# Patient Record
Sex: Male | Born: 1990 | Race: Black or African American | Hispanic: No | Marital: Single | Smoking: Current every day smoker
Health system: Southern US, Community
[De-identification: ages and names within clinical notes are randomized; demographics above are authoritative.]

## PROBLEM LIST (undated history)

## (undated) DIAGNOSIS — I471 Supraventricular tachycardia: Secondary | ICD-10-CM

---

## 2021-11-16 ENCOUNTER — Encounter (HOSPITAL_BASED_OUTPATIENT_CLINIC_OR_DEPARTMENT_OTHER): Payer: Self-pay | Admitting: *Deleted

## 2021-11-16 ENCOUNTER — Emergency Department (HOSPITAL_BASED_OUTPATIENT_CLINIC_OR_DEPARTMENT_OTHER)
Admission: EM | Admit: 2021-11-16 | Discharge: 2021-11-16 | Disposition: A | Discharge status: Home / Self Care | Payer: Self-pay | Attending: Emergency Medicine | Admitting: Emergency Medicine

## 2021-11-16 ENCOUNTER — Other Ambulatory Visit: Payer: Self-pay

## 2021-11-16 ENCOUNTER — Emergency Department (HOSPITAL_BASED_OUTPATIENT_CLINIC_OR_DEPARTMENT_OTHER): Payer: Self-pay

## 2021-11-16 DIAGNOSIS — D72829 Elevated white blood cell count, unspecified: Secondary | ICD-10-CM | POA: Insufficient documentation

## 2021-11-16 DIAGNOSIS — Z720 Tobacco use: Secondary | ICD-10-CM | POA: Insufficient documentation

## 2021-11-16 DIAGNOSIS — R531 Weakness: Secondary | ICD-10-CM | POA: Insufficient documentation

## 2021-11-16 DIAGNOSIS — F1590 Other stimulant use, unspecified, uncomplicated: Secondary | ICD-10-CM | POA: Insufficient documentation

## 2021-11-16 DIAGNOSIS — R0789 Other chest pain: Secondary | ICD-10-CM

## 2021-11-16 DIAGNOSIS — F1510 Other stimulant abuse, uncomplicated: Secondary | ICD-10-CM

## 2021-11-16 DIAGNOSIS — R072 Precordial pain: Secondary | ICD-10-CM | POA: Insufficient documentation

## 2021-11-16 DIAGNOSIS — M79662 Pain in left lower leg: Secondary | ICD-10-CM | POA: Insufficient documentation

## 2021-11-16 DIAGNOSIS — W010XXA Fall on same level from slipping, tripping and stumbling without subsequent striking against object, initial encounter: Secondary | ICD-10-CM | POA: Insufficient documentation

## 2021-11-16 DIAGNOSIS — J00 Acute nasopharyngitis [common cold]: Secondary | ICD-10-CM | POA: Insufficient documentation

## 2021-11-16 HISTORY — DX: Supraventricular tachycardia: I47.1

## 2021-11-16 LAB — RAPID URINE DRUG SCREEN, HOSP PERFORMED
Amphetamines: POSITIVE — AB
Barbiturates: NOT DETECTED
Benzodiazepines: NOT DETECTED
Cocaine: NOT DETECTED
Opiates: NOT DETECTED
Tetrahydrocannabinol: POSITIVE — AB

## 2021-11-16 LAB — BASIC METABOLIC PANEL
Anion gap: 7 (ref 5–15)
Anion gap: 12 (ref 5–15)
BUN: 13 mg/dL (ref 6–20)
BUN: 16 mg/dL (ref 6–20)
CO2: 30 mmol/L (ref 22–32)
CO2: 25 mmol/L (ref 22–32)
Calcium: 9.1 mg/dL (ref 8.9–10.3)
Calcium: 9.2 mg/dL (ref 8.9–10.3)
Chloride: 99 mmol/L (ref 98–111)
Chloride: 99 mmol/L (ref 98–111)
Creatinine, Ser: 1.02 mg/dL (ref 0.61–1.24)
Creatinine, Ser: 0.86 mg/dL (ref 0.61–1.24)
GFR, Estimated: >60 mL/min (ref >60)
GFR, Estimated: >60 mL/min (ref >60)
Glucose, Bld: 116 mg/dL — ABNORMAL HIGH (ref 70–99)
Glucose, Bld: 105 mg/dL — ABNORMAL HIGH (ref 70–99)
Potassium: 3.9 mmol/L (ref 3.5–5.1)
Potassium: 4.2 mmol/L (ref 3.5–5.1)
Sodium: 136 mmol/L (ref 135–145)
Sodium: 136 mmol/L (ref 135–145)

## 2021-11-16 LAB — CBC
HCT: 41.7 % (ref 39.0–52.0)
HCT: 42.3 % (ref 39.0–52.0)
Hemoglobin: 14.1 g/dL (ref 13.0–17.0)
Hemoglobin: 14.1 g/dL (ref 13.0–17.0)
MCH: 30.5 pg (ref 26.0–34.0)
MCH: 30.2 pg (ref 26.0–34.0)
MCHC: 33.8 g/dL (ref 30.0–36.0)
MCHC: 33.3 g/dL (ref 30.0–36.0)
MCV: 90.3 fL (ref 80.0–100.0)
MCV: 90.6 fL (ref 80.0–100.0)
Platelets: 367 10*3/uL (ref 150–400)
Platelets: 377 10*3/uL (ref 150–400)
RBC: 4.62 MIL/uL (ref 4.22–5.81)
RBC: 4.67 MIL/uL (ref 4.22–5.81)
RDW: 12.6 % (ref 11.5–15.5)
RDW: 12.6 % (ref 11.5–15.5)
WBC: 24.2 10*3/uL — ABNORMAL HIGH (ref 4.0–10.5)
WBC: 21.5 10*3/uL — ABNORMAL HIGH (ref 4.0–10.5)
nRBC: 0 % (ref 0.0–0.2)
nRBC: 0 % (ref 0.0–0.2)

## 2021-11-16 LAB — TROPONIN I (HIGH SENSITIVITY): Troponin I (High Sensitivity): 2 ng/L (ref <18)

## 2021-11-16 MED ORDER — SODIUM CHLORIDE 0.9 % IV BOLUS: 1000.0000 mL | Freq: Once | INTRAVENOUS | Status: DC

## 2021-11-16 MED ORDER — DIAZEPAM 5 MG PO TABS
5.0000 mg | ORAL_TABLET | Freq: Once | ORAL | Status: AC
  Administered 2021-11-16: 5 mg via ORAL
  Filled 2021-11-16: qty 1

## 2021-11-16 NOTE — ED Triage Notes (Addendum)
Pt states for last 30 minutes has been feeling cold and feeling weak. Pt states he fell tonight injuring left leg from knee down. Pt has been lying outside in rain, arms are cold to touch. Pt provided with warm blankets and warm dry clothing. Pt denies cough, vomiting, nausea, states was given weed laced with meth a couple of days pta. Pt denies headache.  ?

## 2021-11-16 NOTE — ED Triage Notes (Signed)
Presents with [facility] with mid sternal chest pain, non-radiating. Has hx of WPW and ablation. States was smoking a joint and was laced with methamphetamine.  ?

## 2021-11-16 NOTE — ED Triage Notes (Signed)
Pt arrived via [facility] with reports of generalized weakness and being cold, pt had used some meth 2 days ago, pt seen [facility] today as well. ? ?VSS.  ?

## 2021-11-16 NOTE — Discharge Instructions (Addendum)
It was a pleasure caring for you today in the emergency department. ° °Please return to the emergency department for any worsening or worrisome symptoms. ° ° °

## 2021-11-16 NOTE — ED Provider Notes (Signed)
[facility] EMERGENCY DEPARTMENT Provider Note   CSN: [id number] Arrival date & time: 11/16/21  0830     History  Chief Complaint  Patient presents with   Chest Pain    Roger Marshall is a 31 y.o. male.  This is a 31 y.o. male with significant medical history as below, including WPW status post ablation who presents to the ED with complaint of methamphetamine use, cp, cold.   Per EMS/patient he was seen at [facility] earlier today was discharged. [place] the hospital, pt requested to go to a different hospital than [facility] because he did not like the care that he received at [facility]. Per pt symptoms initially had resolved but after he left he went outside in the cold and his symptoms returned (feeling cold and cp) He has history WPW, status post ablation, recent moved from [place], does not see cardiology locally.  He was spending time with male companion last night, thought he was "smoking a joint" but turns out it was laced with methamphetamines.  Having chest pain, anxiousness, feeling cold.  Chest pain is midsternal, nonradiating, not associated with ambulation or exertion or deep breathing.  Some worsening pain with swallowing.  No dyspnea.  No nausea or vomiting.  No fevers or chills.  He reports regular tobacco use, no cocaine use.  Does not regularly use methamphetamines    Past Medical History: No date: AVNRT (AV nodal re-entry tachycardia) ([place])     Comment:  had ablation for WPW     The history is provided by the patient and the EMS personnel. No language interpreter was used.  Chest Pain Associated symptoms: no palpitations    HPI: A 31 year old patient presents for evaluation of chest pain. Initial onset of pain was approximately 3-6 hours ago. The patient's chest pain is well-localized, is sharp and is not worse with exertion. The patient's chest pain is middle- or left-sided, is not described as  heaviness/pressure/tightness and does not radiate to the arms/jaw/neck. The patient does not complain of nausea and denies diaphoresis. The patient has no history of stroke, has no history of peripheral artery disease, has not smoked in the past 90 days, denies any history of treated diabetes, has no relevant family history of coronary artery disease (first degree relative at less than age 59), is not hypertensive, has no history of hypercholesterolemia and does not have an elevated BMI (>=30).   Home Medications Prior to Admission medications   Not on File      Allergies    Patient has no known allergies.    Review of Systems   Review of Systems  Constitutional:  Positive for chills.  Cardiovascular:  Positive for chest pain. Negative for palpitations.  All other systems reviewed and are negative.  Physical Exam Updated Vital Signs BP (!) 126/91 (BP Location: Right Arm)    Pulse 88    Temp 99 F (37.2 C) (Oral)    Resp 13    Ht 6\' 2"  (1.88 m)    Wt 83.9 kg    SpO2 100%    BMI 23.75 kg/m  Physical Exam Vitals and nursing note reviewed. Exam conducted with a chaperone present.  Constitutional:      General: He is not in acute distress.    Appearance: Normal appearance. He is well-developed. He is not ill-appearing, toxic-appearing or diaphoretic.  HENT:     Head: Normocephalic and atraumatic.     Right Ear:  External ear normal.     Left Ear: External ear normal.     Mouth/Throat:     Mouth: Mucous membranes are moist.  Eyes:     General: No scleral icterus. Cardiovascular:     Rate and Rhythm: Normal rate and regular rhythm.     Pulses: Normal pulses.          Radial pulses are 2+ on the right side and 2+ on the left side.       Dorsalis pedis pulses are 2+ on the right side and 2+ on the left side.     Heart sounds: Normal heart sounds. No murmur heard. Pulmonary:     Effort: Pulmonary effort is normal. No respiratory distress.     Breath sounds: Normal breath sounds. No  decreased breath sounds or wheezing.  Abdominal:     General: Abdomen is flat.     Palpations: Abdomen is soft.     Tenderness: There is no abdominal tenderness.  Musculoskeletal:        General: Normal range of motion.     Cervical back: Normal range of motion.     Right lower leg: No edema.     Left lower leg: No edema.  Skin:    General: Skin is warm and dry.     Capillary Refill: Capillary refill takes less than 2 seconds.  Neurological:     Mental Status: He is alert and oriented to person, place, and time.  Psychiatric:        Mood and Affect: Mood normal.        Speech: Speech normal.        Behavior: Behavior normal. Behavior is cooperative.     Comments: No acute psychosis     ED Results / Procedures / Treatments   Labs (all labs ordered are listed, but only abnormal results are displayed) Labs Reviewed  BASIC METABOLIC PANEL - Abnormal; Notable for the following components:      Result Value   Glucose, Bld 116 (*)    All other components within normal limits  CBC - Abnormal; Notable for the following components:   WBC 24.2 (*)    All other components within normal limits  RAPID URINE DRUG SCREEN, HOSP PERFORMED - Abnormal; Notable for the following components:   Amphetamines POSITIVE (*)    Tetrahydrocannabinol POSITIVE (*)    All other components within normal limits  TROPONIN I (HIGH SENSITIVITY)    EKG EKG Interpretation  Date/Time:  Sunday November 16 2021 08:33:43 EDT Ventricular Rate:  99 PR Interval:  164 QRS Duration: 76 QT Interval:  320 QTC Calculation: 411 R Axis:   21 Text Interpretation: Sinus rhythm Confirmed by Wynona Dove ([id number]) on 11/16/2021 8:43:21 AM  Radiology DG Chest Port 1 View  Result Date: 11/16/2021 CLINICAL DATA:  Acute chest pain EXAM: PORTABLE CHEST 1 VIEW COMPARISON:  None. FINDINGS: The cardiomediastinal silhouette is unremarkable. There is no evidence of focal airspace disease, pulmonary edema, suspicious pulmonary  nodule/mass, pleural effusion, or pneumothorax. No acute bony abnormalities are identified. IMPRESSION: No active disease. Electronically Signed   By: Margarette Canada M.D.   On: 11/16/2021 08:59    Procedures Procedures    Medications Ordered in ED Medications  sodium chloride 0.9 % bolus 1,000 mL (1,000 mLs Intravenous Patient Refused/Not Given 11/16/21 0904)  diazepam (VALIUM) tablet 5 mg (5 mg Oral Given 11/16/21 [id number])    ED Course/ Medical Decision Making/ A&P Clinical Course as of 11/16/21 [phone]  Sun Nov 16, 2021  0852 Rpts was given asa at OSH earlier this morning  [SG]    Clinical Course User Index [SG] Jeanell Sparrow, DO   HEAR Score: 0                       Medical Decision Making Amount and/or Complexity of Data Reviewed Labs: ordered. Radiology: ordered.  Risk Prescription drug management.   Initial Impression and Ddx This patient presents to the Emergency Department for the above complaint. This involves an extensive number of treatment options and is a complaint that carries with it a high risk of complications and morbidity. Vital signs were reviewed. Serious etiologies considered. Ddx includes but is not limited to: ACS, methamphetamine use, metabolic, PE  Patient PMH that increases complexity of ED encounter: Out of area  Additional history obtained from EMS  Interpretation of Diagnostics  Unable to review work-up from [facility], will collect labs and imaging  Well's score is low, my suspicion for PE is low.   Labs & imaging results that were available during my care of the patient were visualized by me and considered in my medical decision making.   I ordered imaging studies which included CXR and I visualized the imaging and I agree with radiologist interpretation. No acute process  Cardiac monitoring reviewed and interpreted personally which shows NSR. EMS rhythm strip reviewed as well. No notable delta wave. EKG w/o acute ischemia   CBC with  leukocytosis, favor stress response in setting of recent methamphetamine use.  No cough or DIB, afebrile, no signs or symptoms associated with infection. Exam is stable. Does not appear septic.  Heart rate stable.  No murmur.  No IV drug use.  No urinary complaints.  Symptoms improved following Valium.  No longer having chest pain.  Symptoms likely secondary to methamphetamine use.  Social determinants of health include - tobacco/meth use  Clinical Course as of 11/16/21 0943  Nancy Fetter Nov 16, 2021  0852 Rpts was given asa at OSH earlier this morning  [SG]    Clinical Course User Index [SG] Jeanell Sparrow, DO    Patient Reassessment and Ultimate Disposition/Management Pt feeling better.   Patient management required discussion with the following services or consulting groups:  None  Complexity of Problems Addressed Acute illness or injury that poses threat of life of bodily function  Additional Data Reviewed and Analyzed Further history obtained from: EMS on arrival  Patient Encounter Risk Assessment Consideration of hospitalization   The patient's chest pain is not suggestive of pulmonary embolus, cardiac ischemia, aortic dissection, pericarditis, myocarditis, pulmonary embolism, pneumothorax, pneumonia, Zoster, or esophageal perforation, or other serious etiology.  Historically not abrupt in onset, tearing or ripping, pulses symmetric. EKG nonspecific for ischemia/infarction. No dysrhythmias, brugada, WPW, prolonged QT noted.   Troponin negative, CXR reviewed. Labs without demonstration of acute pathology unless otherwise noted above. Low HEAR Score  Advised pt to f/u with cardio given hx and being from out of area.   Gait steady, tolerating PO. He did refuse IV fluids.   Given the extremely low risk of these diagnoses further testing and evaluation for these possibilities does not appear to be indicated at this time. Patient in no distress and overall condition improved here in the  ED. Detailed discussions were had with the patient regarding current findings, and need for close f/u with PCP or on call doctor. The patient has been instructed to return immediately if the symptoms worsen  in any way for re-evaluation. Patient verbalized understanding and is in agreement with current care plan. All questions answered prior to discharge.       Counseled patient for approximately 4 minutes regarding smoking cessation. Discussed risks of smoking and how they applied and affected their visit here today. Patient not ready to quit at this time, however will follow up with their primary doctor when they are.   CPT code: [id number]: intermediate counseling for smoking cessation     This chart was dictated using voice recognition software.  Despite best efforts to proofread,  errors can occur which can change the documentation meaning.         Final Clinical Impression(s) / ED Diagnoses Final diagnoses:  Atypical chest pain  Methamphetamine use [facility])    Rx / DC Orders ED Discharge Orders     None         Jeanell Sparrow, DO 11/16/21 [phone]

## 2021-11-16 NOTE — ED Notes (Signed)
ED Provider at bedside. 

## 2021-11-17 ENCOUNTER — Emergency Department: Payer: Medicaid Other

## 2021-11-17 ENCOUNTER — Other Ambulatory Visit: Payer: Self-pay

## 2021-11-17 ENCOUNTER — Emergency Department
Admission: EM | Admit: 2021-11-17 | Discharge: 2021-11-17 | Disposition: A | Discharge status: Home / Self Care | Payer: Medicaid Other | Attending: Emergency Medicine | Admitting: Emergency Medicine

## 2021-11-17 ENCOUNTER — Encounter: Payer: Self-pay | Admitting: Emergency Medicine

## 2021-11-17 ENCOUNTER — Emergency Department
Admission: EM | Admit: 2021-11-17 | Discharge: 2021-11-17 | Disposition: A | Discharge status: Home / Self Care | Payer: Medicaid Other | Attending: Student in an Organized Health Care Education/Training Program | Admitting: Student in an Organized Health Care Education/Training Program

## 2021-11-17 ENCOUNTER — Encounter: Payer: Self-pay | Admitting: Radiology

## 2021-11-17 DIAGNOSIS — M25562 Pain in left knee: Secondary | ICD-10-CM | POA: Insufficient documentation

## 2021-11-17 DIAGNOSIS — M79672 Pain in left foot: Secondary | ICD-10-CM | POA: Insufficient documentation

## 2021-11-17 DIAGNOSIS — R079 Chest pain, unspecified: Secondary | ICD-10-CM | POA: Insufficient documentation

## 2021-11-17 DIAGNOSIS — X503XXA Overexertion from repetitive movements, initial encounter: Secondary | ICD-10-CM | POA: Insufficient documentation

## 2021-11-17 DIAGNOSIS — W19XXXA Unspecified fall, initial encounter: Secondary | ICD-10-CM

## 2021-11-17 DIAGNOSIS — R519 Headache, unspecified: Secondary | ICD-10-CM | POA: Insufficient documentation

## 2021-11-17 DIAGNOSIS — M79605 Pain in left leg: Secondary | ICD-10-CM

## 2021-11-17 DIAGNOSIS — Y9301 Activity, walking, marching and hiking: Secondary | ICD-10-CM | POA: Insufficient documentation

## 2021-11-17 DIAGNOSIS — M79671 Pain in right foot: Secondary | ICD-10-CM

## 2021-11-17 LAB — HEPATIC FUNCTION PANEL
ALT: 16 U/L (ref 0–44)
AST: 28 U/L (ref 15–41)
Albumin: 3.9 g/dL (ref 3.5–5.0)
Alkaline Phosphatase: 58 U/L (ref 38–126)
Bilirubin, Direct: 0.1 mg/dL (ref 0.0–0.2)
Indirect Bilirubin: 0.6 mg/dL (ref 0.3–0.9)
Total Bilirubin: 0.7 mg/dL (ref 0.3–1.2)
Total Protein: 7.6 g/dL (ref 6.5–8.1)

## 2021-11-17 LAB — PROCALCITONIN: Procalcitonin: <0.1 ng/mL

## 2021-11-17 LAB — TSH: TSH: 0.973 u[IU]/mL (ref 0.350–4.500)

## 2021-11-17 LAB — TROPONIN I (HIGH SENSITIVITY): Troponin I (High Sensitivity): 7 ng/L (ref <18)

## 2021-11-17 LAB — CK: Total CK: 383 U/L (ref 49–397)

## 2021-11-17 LAB — MAGNESIUM: Magnesium: 1.9 mg/dL (ref 1.7–2.4)

## 2021-11-17 MED ORDER — ACETAMINOPHEN 325 MG PO TABS
650.0000 mg | ORAL_TABLET | Freq: Once | ORAL | Status: AC
  Administered 2021-11-17: 650 mg via ORAL
  Filled 2021-11-17: qty 2

## 2021-11-17 MED ORDER — ACETAMINOPHEN 500 MG PO TABS
1000.0000 mg | ORAL_TABLET | Freq: Once | ORAL | Status: AC
  Administered 2021-11-17: 1000 mg via ORAL
  Filled 2021-11-17: qty 2

## 2021-11-17 NOTE — ED Notes (Signed)
See triage note. Pt with strong bilateral pedal pulses. Cap refill <3 sec. Pain with palpation at bilateral lateral maleolus.  ?

## 2021-11-17 NOTE — ED Provider Notes (Signed)
? ?[facility] ?Provider Note ? ? ? Event Date/Time  ? First MD Initiated Contact with Patient 11/17/21 0031   ?  (approximate) ? ? ?History  ? ?Weakness ? ? ?HPI ? ?Roger Marshall is a 31 y.o. male with a history of WPW who presents for evaluation of left leg pain.  Patient was seen earlier today twice at 2 different hospitals.  He tells me that he came from [place] to visit a girl.  He is no longer with the grow and has nowhere to go.  He does not have money to go back to [place].  It is very cold outside and raining and he had no place to go, he cannot afford to go to a hotel.  He said that he slipped on the rain and fell and is complaining of left leg pain from the fall.  Denies head trauma or LOC.  Denies any other pain.  Patient is requesting a place to spend the night that is warm and outside of the rain.  Denies any chest pain or shortness of breath ?  ? ? ?Past Medical History:  ?Diagnosis Date  ? AVNRT (AV nodal re-entry tachycardia) ([place])   ? had ablation for WPW  ? ? ? ?Physical Exam  ? ?Triage Vital Signs: ?ED Triage Vitals  ?Enc Vitals Group  ?   BP 11/16/21 2250 (!) 113/104  ?   Pulse Rate 11/16/21 2249 100  ?   Resp 11/16/21 2249 14  ?   Temp 11/16/21 2249 98.8 ?F (37.1 ?C)  ?   Temp Source 11/16/21 2249 Oral  ?   SpO2 11/16/21 2249 100 %  ?   Weight 11/16/21 2249 185 lb (83.9 kg)  ?   Height 11/16/21 2249 6\' 2"  (1.88 m)  ?   Head Circumference --   ?   Peak Flow --   ?   Pain Score 11/16/21 2249 9  ?   Pain Loc --   ?   Pain Edu? --   ?   Excl. in New Site? --   ? ? ?Most recent vital signs: ?Vitals:  ? 11/17/21 0052 11/17/21 0206  ?BP:  (!) 154/85  ?Pulse:  (!) 105  ?Resp:  20  ?Temp:    ?SpO2: 100% 100%  ? ? ? ?Constitutional: Alert and oriented. Well appearing and in no apparent distress. ?HEENT: ?     Head: Normocephalic and atraumatic.    ?     Eyes: Conjunctivae are normal. Sclera is non-icteric.  ?     Mouth/Throat: Mucous membranes are moist.  ?     Neck: Supple with no  signs of meningismus. ?Cardiovascular: Regular rate and rhythm. No murmurs, gallops, or rubs. 2+ symmetrical distal pulses are present in all extremities.  ?Respiratory: Normal respiratory effort. Lungs are clear to auscultation bilaterally.  ?Gastrointestinal: Soft, non tender, and non distended with positive bowel sounds. No rebound or guarding. ?Genitourinary: No CVA tenderness. ?Musculoskeletal:  No edema, cyanosis, or erythema of extremities.  Full painless range of motion of all joints in the left lower extremity with no signs of trauma ?Neurologic: Normal speech and language. Face is symmetric. Moving all extremities. No gross focal neurologic deficits are appreciated. ?Skin: Skin is warm, dry and intact. No rash noted. ?Psychiatric: Mood and affect are normal. Speech and behavior are normal. ? ?ED Results / Procedures / Treatments  ? ?Labs ?(all labs ordered are listed, but only abnormal results are displayed) ?Labs Reviewed  ?  BASIC METABOLIC PANEL - Abnormal; Notable for the following components:  ?    Result Value  ? Glucose, Bld 105 (*)   ? All other components within normal limits  ?CBC - Abnormal; Notable for the following components:  ? WBC 21.5 (*)   ? All other components within normal limits  ?CK  ?HEPATIC FUNCTION PANEL  ?MAGNESIUM  ?PROCALCITONIN  ?TSH  ? ? ? ?EKG ? ?ED ECG REPORT ?I, Rudene Re, the attending physician, personally viewed and interpreted this ECG. ? ?Sinus rhythm with a rate of 99, normal intervals, normal axis, no ST elevations or depressions ? ?RADIOLOGY ?I, Rudene Re, attending MD, have personally viewed and interpreted the images obtained during this visit as below: ? ?X-ray of the leg negative for traumatic injury ? ?Chest x-ray is negative ? ? ?___________________________________________________ ?Interpretation by Radiologist:  ?DG Tibia/Fibula Left ? ?Result Date: 11/17/2021 ?CLINICAL DATA:  Fall. EXAM: LEFT TIBIA AND FIBULA - 2 VIEW COMPARISON:  None.  FINDINGS: There is no evidence of fracture or other focal bone lesions. Soft tissues are unremarkable. IMPRESSION: Negative. Electronically Signed   By: Anner Crete M.D.   On: 11/17/2021 01:13  ? ?DG Chest Port 1 View ? ?Result Date: 11/16/2021 ?CLINICAL DATA:  Acute chest pain EXAM: PORTABLE CHEST 1 VIEW COMPARISON:  None. FINDINGS: The cardiomediastinal silhouette is unremarkable. There is no evidence of focal airspace disease, pulmonary edema, suspicious pulmonary nodule/mass, pleural effusion, or pneumothorax. No acute bony abnormalities are identified. IMPRESSION: No active disease. Electronically Signed   By: Margarette Canada M.D.   On: 11/16/2021 08:59   ? ? ? ?PROCEDURES: ? ?Critical Care performed: No ? ?Procedures ? ? ? ?IMPRESSION / MDM / ASSESSMENT AND PLAN / ED COURSE  ?I reviewed the triage vital signs and the nursing notes. ? ?31 y.o. male with a history of WPW who presents for evaluation of left leg pain after slipping and falling outside.  Patient is from [place].  Has no money to return.  Has nowhere to go tonight.  It is cold and raining outside and he had nowhere to go.  He is here for shelter.  Also wanted to get checked out because he slipped and fell and is complaining of left lower leg pain.  On exam there is no signs of trauma.  X-ray was negative for traumatic injury.  Initially in triage patient was complaining of generalized weakness as well after smoking marijuana that was laced with meth.  He denies IV drug use.  Based on this complaint several labs were ordered.  Does have an elevated white count which is trending down from his recent visit to an outside hospital which is most likely stress-induced from meth use.  No signs of infectious etiology, no fever, negative procalcitonin.  Labs showing no signs of dehydration, AKI, or electrolyte derangements.  No signs of rhabdo.  Will allow patient to spend the night in our waiting room where he is nice and warm to the morning.  No  indications for admission. ? ?MEDICATIONS GIVEN IN ED: ?Medications  ?acetaminophen (TYLENOL) tablet 1,000 mg (1,000 mg Oral Given 11/17/21 0056)  ? ?EMR reviewed including patient's recent visit to an outside hospital for chest pain yesterday. ? ? ? ?FINAL CLINICAL IMPRESSION(S) / ED DIAGNOSES  ? ?Final diagnoses:  ?Fall, initial encounter  ?Left leg pain  ? ? ? ?Rx / DC Orders  ? ?ED Discharge Orders   ? ? None  ? ?  ? ? ? ?  Note:  This document was prepared using Dragon voice recognition software and may include unintentional dictation errors. ? ? ?Please note:  Patient was evaluated in Emergency Department today for the symptoms described in the history of present illness. Patient was evaluated in the context of the global COVID-19 pandemic, which necessitated consideration that the patient might be at risk for infection with the SARS-CoV-2 virus that causes COVID-19. Institutional protocols and algorithms that pertain to the evaluation of patients at risk for COVID-19 are in a state of rapid change based on information released by regulatory bodies including the CDC and federal and state organizations. These policies and algorithms were followed during the patient's care in the ED.  Some ED evaluations and interventions may be delayed as a result of limited staffing during the pandemic. ? ? ? ? ?  ?Rudene Re, MD ?11/17/21 [phone] ? ?

## 2021-11-17 NOTE — ED Provider Notes (Signed)
? ?[facility] ?Provider Note ? ? ? Event Date/Time  ? First MD Initiated Contact with Patient 11/17/21 0830   ?  (approximate) ? ? ?History  ? ?Headache and Chest Pain ? ? ?HPI ? ?Roger Marshall is a 31 y.o. male who is homeless [place] and admits to recent use of methamphetamine presents to the ER for evaluation of chest pain and left knee pain.  Seen multiple times over the past 24 hours for similar symptoms.  Feels like he is having recurrence of worsening chest pain.  Denies any diaphoresis.  Denies any pressure.  No worsening with deep inspiration.  No interval falls.  Denies any fevers or chills no abdominal pain.  Able to ambulate.  States would like some tylenol for the pain. ?  ? ? ?Physical Exam  ? ?Triage Vital Signs: ?ED Triage Vitals  ?Enc Vitals Group  ?   BP 11/17/21 0827 (!) 136/97  ?   Pulse Rate 11/17/21 0827 62  ?   Resp 11/17/21 0827 18  ?   Temp 11/17/21 0827 98.7 ?F (37.1 ?C)  ?   Temp Source 11/17/21 0827 Oral  ?   SpO2 11/17/21 0827 96 %  ?   Weight 11/17/21 0826 183 lb 6.4 oz (83.2 kg)  ?   Height 11/17/21 0826 6\' 2"  (1.88 m)  ?   Head Circumference --   ?   Peak Flow --   ?   Pain Score 11/17/21 0826 6  ?   Pain Loc --   ?   Pain Edu? --   ?   Excl. in [place]? --   ? ? ?Most recent vital signs: ?Vitals:  ? 11/17/21 0827  ?BP: (!) 136/97  ?Pulse: 62  ?Resp: 18  ?Temp: 98.7 ?F (37.1 ?C)  ?SpO2: 96%  ? ? ? ?Constitutional: Alert  ?Eyes: Conjunctivae are normal.  ?Head: Atraumatic. ?Nose: No congestion/rhinnorhea. ?Mouth/Throat: Mucous membranes are moist.   ?Neck: Painless ROM.  ?Cardiovascular:   Good peripheral circulation. ?Respiratory: Normal respiratory effort.  No retractions.  ?Gastrointestinal: Soft and nontender.  ?Musculoskeletal:  no deformity ?Neurologic:  MAE spontaneously. No gross focal neurologic deficits are appreciated.  ?Skin:  Skin is warm, dry and intact. No rash noted. ?Psychiatric: Mood and affect are normal. Somewhat odd ? ? ? ?ED Results  / Procedures / Treatments  ? ?Labs ?(all labs ordered are listed, but only abnormal results are displayed) ?Labs Reviewed  ?TROPONIN I (HIGH SENSITIVITY)  ? ? ? ?EKG ? ?ED ECG REPORT ?I, Merlyn Lot, the attending physician, personally viewed and interpreted this ECG. ? ? Date: 11/17/2021 ? EKG Time: 8:24 ? Rate: 95 ? Rhythm: sinus ? Axis: normal ? Intervals:normal ? ST&T Change: no stemi, normal intervals ? ? ? ?RADIOLOGY ?Please see ED Course for my review and interpretation. ? ?I personally reviewed all radiographic images ordered to evaluate for the above acute complaints and reviewed radiology reports and findings.  These findings were personally discussed with the patient.  Please see medical record for radiology report. ? ? ? ?PROCEDURES: ? ?Critical Care performed:  ? ?Procedures ? ? ?MEDICATIONS ORDERED IN ED: ?Medications  ?acetaminophen (TYLENOL) tablet 650 mg (650 mg Oral Given 11/17/21 0856)  ? ? ? ?IMPRESSION / MDM / ASSESSMENT AND PLAN / ED COURSE  ?I reviewed the triage vital signs and the nursing notes. ?             ?               ? ?  Differential diagnosis includes, but is not limited to, msk strain, pleurisy, acs, pe, dissection, pna, ptx, substance abuse, homelessness, malingering ? ?Patient presenting with symptoms as described above.  Somewhat odd appearing likely secondary to recent meth use no SI or HI he is currently calm and cooperative.  Symptoms have been ongoing for more than 24 hours repeat EKG is nonischemic we will check troponin.  Just had extensive laboratory work-up last night which I do not feel requires repeating as he is not had any change in symptoms.  He is able to ambulate on the left knee.  Not consistent with septic arthritis likely musculoskeletal strain.  He is low risk by Wells criteria is PERC negative.  No fever.  Troponin negative.  Chest x-ray on my review does not show evidence of pneumothorax or consolidation.  Patient given Tylenol.  Stable and appropriate  for discharge from the ER. ? ? ?  ? ? ?FINAL CLINICAL IMPRESSION(S) / ED DIAGNOSES  ? ?Final diagnoses:  ?Nonspecific chest pain  ? ? ? ?Rx / DC Orders  ? ?ED Discharge Orders   ? ? None  ? ?  ? ? ? ?Note:  This document was prepared using Dragon voice recognition software and may include unintentional dictation errors. ? ?  ?Merlyn Lot, MD ?11/17/21 0930 ? ?

## 2021-11-17 NOTE — ED Notes (Signed)
FIRST NURSE NOTE: Pt has been visualized roaming around the lobby and asking visitors for a lighter for approx a hour before checking in. ?

## 2021-11-17 NOTE — ED Triage Notes (Signed)
Pt to ED with c/o of headache and chest pain. He was seen last evening  and discharged. Pt was seen hanging around the lobby before checking in. When approached earlier he told us that he wanted to go out and smoke before checking in. He is now complaining in knee pain (they did xrays last night) and wants pain medication for it. ?

## 2021-11-17 NOTE — Discharge Instructions (Addendum)
-  Minimize the amount of walking you do for the next 2 to 3 days. ?-Take Tylenol/ibuprofen as needed for pain. ?-Return to the emergency department anytime if you begin to experience any new or worsening symptoms. ?

## 2021-11-17 NOTE — ED Notes (Signed)
Snack given at this time per pt request.  Pt encouraged to let this RN know of any additional needs. ?

## 2021-11-17 NOTE — ED Notes (Signed)
FIRST NURSE NOTE; pt via EMS from the side of the road, pt told EMS he could not walk because his ankles were swollen. VSS. Pt was ambulatory on scene. No edema or swelling noted. Pt is A&OX4 and NAD.  ? ?Pt was recently discharged last night night and this morning.  ?

## 2021-11-17 NOTE — ED Notes (Signed)
Pt wrapped bilateral feet in ace wrap, refused RN assistance. Pt ambulatory per self without assistance. ?

## 2021-11-17 NOTE — ED Provider Notes (Signed)
? ?[facility] ?Provider Note ? ? ? Event Date/Time  ? First MD Initiated Contact with Patient 11/17/21 1906   ?  (approximate) ? ? ?History  ? ?Chief Complaint ?Ankle Pain ? ? ?HPI ?Printess Hartung is a 31 y.o. male, history of AV and RT, presents to the emergency department for evaluation of bilateral foot pain.  Patient states that he does a lot of walking every day and that is why his feet hurt.  Unable to localize where exactly his feet hurt.  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  Patient is not willing to provide any further details.  Patient has recently been seen multiple times in the emergency department over the past 24 hours with various complaints, ultimately negative work-ups.  Upon further discussion, patient states that he just wants his feet wrapped and to have some water to drink.  When asked about his living situation, he states that he does not want to talk about it.  Denies fever/chills, chest pain, abdominal pain, back pain, urinary symptoms, nausea/vomiting, or numbness/tingling in upper or lower extremities. ? ?History Limitations: No limitations. ? ?  ? ? ?Physical Exam  ?Triage Vital Signs: ?ED Triage Vitals  ?Enc Vitals Group  ?   BP 11/17/21 1743 112/62  ?   Pulse Rate 11/17/21 1743 99  ?   Resp 11/17/21 1743 17  ?   Temp 11/17/21 1743 98.8 ?F (37.1 ?C)  ?   Temp Source 11/17/21 1743 Oral  ?   SpO2 11/17/21 1743 100 %  ?   Weight 11/17/21 1744 183 lb 6.4 oz (83.2 kg)  ?   Height 11/17/21 1744 6\' 2"  (1.88 m)  ?   Head Circumference --   ?   Peak Flow --   ?   Pain Score 11/17/21 1744 6  ?   Pain Loc --   ?   Pain Edu? --   ?   Excl. in [place]? --   ? ? ?Most recent vital signs: ?Vitals:  ? 11/17/21 1743  ?BP: 112/62  ?Pulse: 99  ?Resp: 17  ?Temp: 98.8 ?F (37.1 ?C)  ?SpO2: 100%  ? ? ?General: Awake, NAD.  ?CV: Good peripheral perfusion.  ?Resp: Normal effort.  ?Abd: Soft, non-tender. No distention.  ?Neuro: At baseline. No gross neurological deficits.  ?Other: No  gross deformities appreciated in the lower extremities.  No significant tenderness, warmth, erythema, or swelling in his feet.  Pulse, motor, sensation intact distally, bilaterally.  Patient is able to ambulate well on his own. ? ?Physical Exam ? ? ? ?ED Results / Procedures / Treatments  ?Labs ?(all labs ordered are listed, but only abnormal results are displayed) ?Labs Reviewed - No data to display ? ? ?EKG ?Not applicable. ? ? ?RADIOLOGY ? ?ED Provider Interpretation: Not applicable. ? ? ?PROCEDURES: ? ?Critical Care performed: None. ? ?Procedures ? ? ? ?MEDICATIONS ORDERED IN ED: ?Medications - No data to display ? ? ?IMPRESSION / MDM / ASSESSMENT AND PLAN / ED COURSE  ?I reviewed the triage vital signs and the nursing notes. ?             ?               ? ?Differential diagnosis includes, but is not limited to, ankle sprain, plantar fasciitis, metatarsal stress fracture, Morton's neuroma. ? ?ED Course ?Patient appears well.  Vital signs within normal limits.  NAD. ? ?Patient states that he does not want any work-up or  imaging for his feet.  Provided patient with crackers, water, and soda.  Additionally provided him with Ace bandages for his feet. ? ?Assessment/Plan ?Given the patient's history and physical exam, I do not suspect any serious or life-threatening pathology.  No clear illnesses or injuries present.  Low suspicion for foot/ankle fractures given unremarkable history and physical exam.  Likely has benign foot pain secondary to frequent walking versus plantar fasciitis.  Patient is not interested in further imaging or work-up.  Provide the patient with new socks and Ace bandages for comfort.  Patient states that he does not want anything else at this time.  We will plan to discharge. ? ?Patient was provided with anticipatory guidance, return precautions, and educational material. Encouraged the patient to return to the emergency department at any time if they begin to experience any new or worsening  symptoms.  ? ?  ? ? ?FINAL CLINICAL IMPRESSION(S) / ED DIAGNOSES  ? ?Final diagnoses:  ?Foot pain, bilateral  ? ? ? ?Rx / DC Orders  ? ?ED Discharge Orders   ? ? None  ? ?  ? ? ? ?Note:  This document was prepared using Dragon voice recognition software and may include unintentional dictation errors. ?  ?Teodoro Spray, [place] ?11/17/21 2223 ? ?  ?Delman Kitten, MD ?11/17/21 2352 ? ?

## 2021-11-17 NOTE — ED Triage Notes (Signed)
Pt comes into the ED via EMS, pt c/o BL ankle and foot pain with swelling , pt does a lot of walking everyday, pt was seen in the ED earlier today and last night for other complaints,  ?

## 2021-11-18 ENCOUNTER — Encounter: Payer: Self-pay | Admitting: Emergency Medicine

## 2021-11-18 ENCOUNTER — Emergency Department
Admission: EM | Admit: 2021-11-18 | Discharge: 2021-11-18 | Disposition: A | Discharge status: Home / Self Care | Payer: Medicaid Other | Attending: Emergency Medicine | Admitting: Emergency Medicine

## 2021-11-18 ENCOUNTER — Other Ambulatory Visit: Payer: Self-pay

## 2021-11-18 DIAGNOSIS — Z765 Malingerer [conscious simulation]: Secondary | ICD-10-CM | POA: Insufficient documentation

## 2021-11-18 DIAGNOSIS — M25572 Pain in left ankle and joints of left foot: Secondary | ICD-10-CM | POA: Insufficient documentation

## 2021-11-18 DIAGNOSIS — M79672 Pain in left foot: Secondary | ICD-10-CM | POA: Insufficient documentation

## 2021-11-18 DIAGNOSIS — R0789 Other chest pain: Secondary | ICD-10-CM | POA: Insufficient documentation

## 2021-11-18 MED ORDER — NAPROXEN 500 MG PO TABS: 500.0000 mg | ORAL_TABLET | Freq: Once | ORAL | Status: DC

## 2021-11-18 NOTE — ED Triage Notes (Signed)
Presents via EMS  per EMS they picked him up at [facility]  states he developed chest pain 10 mins prior to their arrival   also having some ankle pain  pt has been seen 3 times yesterday for same ?

## 2021-11-18 NOTE — ED Provider Notes (Signed)
? ?[facility] ?Provider Note ? ? ? Event Date/Time  ? First MD Initiated Contact with Patient 11/18/21 0530   ?  (approximate) ? ? ?History  ? ?Foot Pain ? ? ?HPI ? ?Roger Marshall is a 31 y.o. 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  This is patient's 12th visit to the ER in the last 10 days.  I saw patient last night for a fall and leg pain he represented yesterday morning and again in the afternoon for various complaints.  When I saw patient yesterday he told me that he had been in [place] for a month.  He is from [place] and he is stranded in [place] without money to be able to go back home.  He is homeless and has nowhere to go.  He cannot afford to pay for hotel.  He has literally been living in our waiting room for the last 48 hours.  He spent the entire night in the waiting room sleeping and this morning when security tried to escort him out he decided to check back in for complaints of left foot pain. ?  ? ? ?Past Medical History:  ?Diagnosis Date  ? AVNRT (AV nodal re-entry tachycardia) (HCC)   ? had ablation for WPW  ? ? ?History reviewed. No pertinent surgical history. ? ? ?Physical Exam  ? ?Triage Vital Signs: ?ED Triage Vitals  ?Enc Vitals Group  ?   BP 11/18/21 0524 (!) 121/95  ?   Pulse Rate 11/18/21 0524 (!) 110  ?   Resp 11/18/21 0524 18  ?   Temp 11/18/21 0524 98.3 ?F (36.8 ?C)  ?   Temp Source 11/18/21 0524 Oral  ?   SpO2 11/18/21 0524 97 %  ?   Weight 11/18/21 0525 183 lb 6.8 oz (83.2 kg)  ?   Height 11/18/21 0525 6\' 2"  (1.88 m)  ?   Head Circumference --   ?   Peak Flow --   ?   Pain Score 11/18/21 0525 7  ?   Pain Loc --   ?   Pain Edu? --   ?   Excl. in [place]? --   ? ? ?Most recent vital signs: ?Vitals:  ? 11/18/21 0524  ?BP: (!) 121/95  ?Pulse: (!) 110  ?Resp: 18  ?Temp: 98.3 ?F (36.8 ?C)  ?SpO2: 97%  ? ? ? ?Constitutional: Alert and oriented. Well appearing and in no apparent distress. ?HEENT: ?      Head: Normocephalic and atraumatic.    ?     Eyes: Conjunctivae are normal. Sclera is non-icteric.  ?     Mouth/Throat: Mucous membranes are moist.  ?     Neck: Supple with no signs of meningismus. ?Cardiovascular: Regular rate and rhythm.  ?Respiratory: Normal respiratory effort. Lungs are clear to auscultation bilaterally.  ?Gastrointestinal: Soft, non tender. ?Musculoskeletal:  No edema, cyanosis, or erythema of extremities. ?Neurologic: Normal speech and language. Face is symmetric. Moving all extremities. No gross focal neurologic deficits are appreciated. ?Skin: Skin is warm, dry and intact. No rash noted. ?Psychiatric: Mood and affect are normal. Speech and behavior are normal. ? ?ED Results / Procedures / Treatments  ? ?Labs ?(all labs ordered are listed, but only abnormal results are displayed) ?Labs Reviewed - No data to display ? ? ?EKG ? ?none ? ? ?RADIOLOGY ?none ? ? ?PROCEDURES: ? ?Critical Care performed: No ? ?Procedures ? ? ? ?  IMPRESSION / MDM / ASSESSMENT AND PLAN / ED COURSE  ?I reviewed the triage vital signs and the nursing notes. ? ?31 y.o. 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  This is patient's 12th visit to the ER in the last 10 days.  I saw patient last night for a fall and leg pain he represented yesterday morning and again in the afternoon for various complaints.  When I saw patient yesterday he told me that he had been in [place] for a month.  He is from [place] and he is stranded in [place] without money to be able to go back home.  He is homeless and has nowhere to go.  He cannot afford to pay for hotel.  He has literally been living in our waiting room for the last 48 hours.  He spent the entire night in the waiting room sleeping and this morning when security tried to escort him out he decided to check back in for complaints of left foot pain.  Examination of the feet showed no abnormalities, no signs of cellulitis,  ischemia, no open sores.  Strong pulses.  No signs of trauma. ? ?At this point his presentation is consistent with malingering since patient has spent the entire night in our waiting room and only wants to get checked in once he is asked to leave the hospital. He has undergone extensive testing for several complaints ranging from CP to leg pain, to foot pain, to syncope.  ? ?There is no indication for labs or imaging during this visit as I clinically do not see any acute emergencies that need to be worked up at this time.  I have done extensive review of all the tests and visits patient has had over the last 10 days.  At this time his presentation is consistent with malingering and patient is stable for discharge ? ?MEDICATIONS GIVEN IN ED: ?Medications - No data to display ? ? ? ?FINAL CLINICAL IMPRESSION(S) / ED DIAGNOSES  ? ?Final diagnoses:  ?Malingering  ? ? ? ?Rx / DC Orders  ? ?ED Discharge Orders   ? ? None  ? ?  ? ? ? ?Note:  This document was prepared using Dragon voice recognition software and may include unintentional dictation errors. ? ? ?Please note:  Patient was evaluated in Emergency Department today for the symptoms described in the history of present illness. Patient was evaluated in the context of the global COVID-19 pandemic, which necessitated consideration that the patient might be at risk for infection with the SARS-CoV-2 virus that causes COVID-19. Institutional protocols and algorithms that pertain to the evaluation of patients at risk for COVID-19 are in a state of rapid change based on information released by regulatory bodies including the CDC and federal and state organizations. These policies and algorithms were followed during the patient's care in the ED.  Some ED evaluations and interventions may be delayed as a result of limited staffing during the pandemic. ? ? ? ? ?  ?Nita Sickle, MD ?11/18/21 [phone] ? ?

## 2021-11-18 NOTE — ED Provider Notes (Addendum)
? ?[facility] ?Provider Note ? ? ? Event Date/Time  ? First MD Initiated Contact with Patient 11/18/21 0745   ?  (approximate) ? ? ?History  ? ?Ankle Pain and Chest Pain ? ? ?HPI ? ?Roger Marshall is a 31 y.o. male who presents with complaints of left ankle pain.  Notably patient has been seen 5 times in our emergency department over the last 48 hours for various complaints.  He was recently discharged 3 hours ago after being seen for foot pain.  Also complained of chest discomfort upon arrival to our emergency department.  He had a cardiac work-up within the last 48 hours which was reassuring.  Patient appears to be malingering based on rapidly changing complaints, multiple visits to the ED within a 48-hour period ? ?  ? ? ?Physical Exam  ? ?Triage Vital Signs: ?ED Triage Vitals  ?Enc Vitals Group  ?   BP 11/18/21 0743 134/86  ?   Pulse Rate 11/18/21 0743 (!) 104  ?   Resp 11/18/21 0743 18  ?   Temp 11/18/21 0743 (!) 97.5 ?F (36.4 ?C)  ?   Temp src --   ?   SpO2 11/18/21 0743 100 %  ?   Weight 11/18/21 0737 83.2 kg (183 lb 6.8 oz)  ?   Height 11/18/21 0737 1.88 m (6\' 2" )  ?   Head Circumference --   ?   Peak Flow --   ?   Pain Score --   ?   Pain Loc --   ?   Pain Edu? --   ?   Excl. in [place]? --   ? ? ?Most recent vital signs: ?Vitals:  ? 11/18/21 0743  ?BP: 134/86  ?Pulse: (!) 104  ?Resp: 18  ?Temp: (!) 97.5 ?F (36.4 ?C)  ?SpO2: 100%  ? ? ? ?General: Awake, no distress.  Has a bag of potato chips that he is eating and candy bars ?CV:  Good peripheral perfusion.  Regular rate and rhythm ?Resp:  Normal effort.  CTA B ?Abd:  No distention.  ?Other:  Ankle: No significant swelling, no pain with axial load, no redness or injury noted ? ? ?ED Results / Procedures / Treatments  ? ?Labs ?(all labs ordered are listed, but only abnormal results are displayed) ?Labs Reviewed - No data to display ? ? ?EKG ? ? ? ? ?RADIOLOGY ? ? ? ? ?PROCEDURES: ? ?Critical Care performed:  ? ?Procedures ? ? ?MEDICATIONS  ORDERED IN ED: ?Medications - No data to display ? ? ?IMPRESSION / MDM / ASSESSMENT AND PLAN / ED COURSE  ?I reviewed the triage vital signs and the nursing notes. ? ?Patient appears to be abusing the emergency department.  He has changing complaints, has received multiple evaluations.  In between visits has spent significant amount of time in our waiting room apparently ? ?No indication for emergent work-up at this time, will discharge the patient, offered community resources ? ? ? ?----------------------------------------- ?8:36 AM on 11/18/2021 ?----------------------------------------- ? ?Patient is refusing to leave room, we will have security escort him out ? ? ? ? ? ?  ? ? ?FINAL CLINICAL IMPRESSION(S) / ED DIAGNOSES  ? ?Final diagnoses:  ?Left ankle pain, unspecified chronicity  ? ? ? ?Rx / DC Orders  ? ?ED Discharge Orders   ? ? None  ? ?  ? ? ? ?Note:  This document was prepared using Dragon voice recognition software and may include unintentional dictation errors. ?  ?  Jene Every, MD ?11/18/21 [phone] ? ?  ?Jene Every, MD ?11/18/21 [phone] ? ?

## 2021-11-18 NOTE — ED Notes (Signed)
Dr Alfred Levins to triage to examine pt ?

## 2021-11-18 NOTE — ED Notes (Signed)
Discharge instructions offer to pt   states he wanted to see someone else  Provider aware  states pt discharged  ?

## 2021-11-18 NOTE — ED Notes (Signed)
Dr Cyril Loosen in with pt. ?

## 2021-11-18 NOTE — ED Triage Notes (Signed)
Pt ambulatory to STAT desk to st he would like to check in for his "foot pain"; pt has been here since Sunday, being seen x 3 and staying in lobby after each discharge ?

## 2023-02-22 IMAGING — DX DG CHEST 1V PORT
1 series · 1 of 1 positions shown · non-contrast
Comparison: None.

CLINICAL DATA: Acute chest pain

EXAM:
PORTABLE CHEST 1 VIEW

[chest ap]
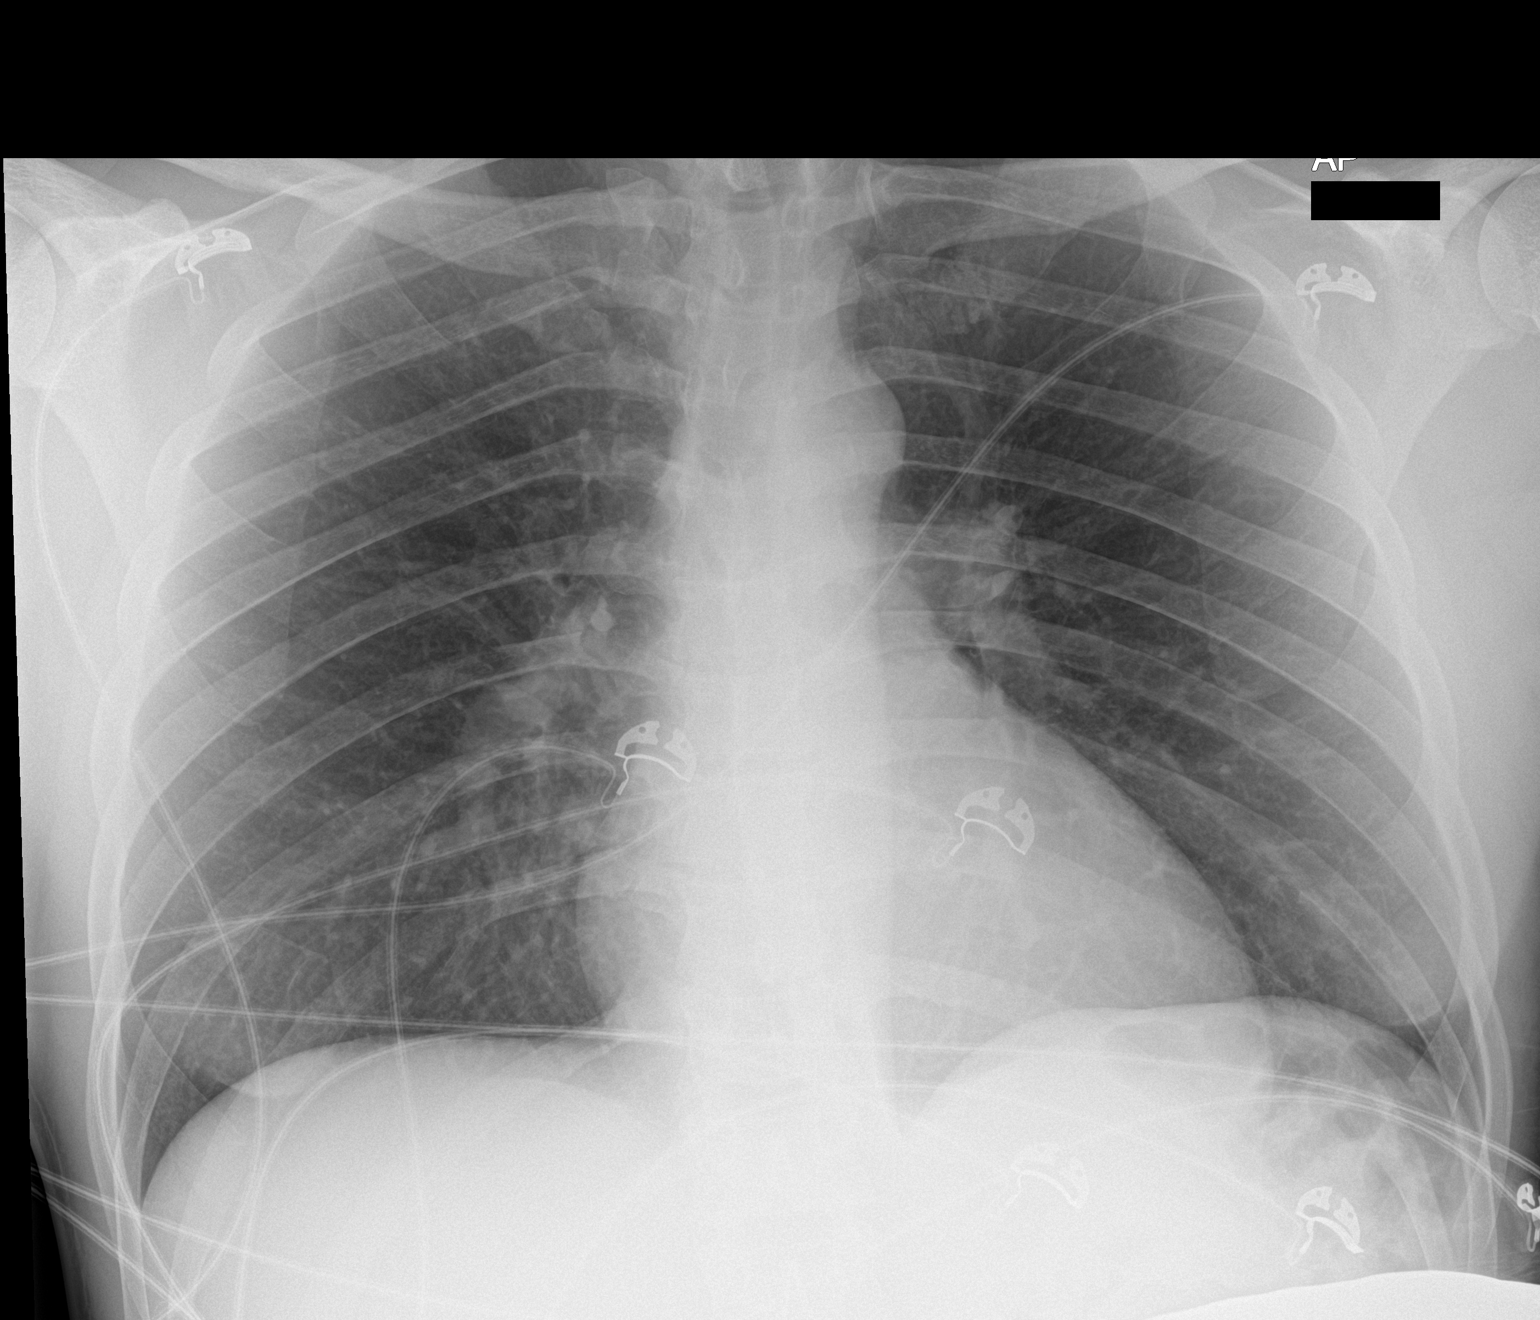

[1 of 1 positions shown; findings below may reference images not displayed]

FINDINGS: The cardiomediastinal silhouette is unremarkable.

There is no evidence of focal airspace disease, pulmonary edema,
suspicious pulmonary nodule/mass, pleural effusion, or pneumothorax.

No acute bony abnormalities are identified.
IMPRESSION: No active disease.

## 2023-02-23 IMAGING — DX DG CHEST 1V
1 series · 1 of 1 positions shown · non-contrast
Comparison: 11/16/2021

CLINICAL DATA: Chest pain

EXAM:
CHEST  1 VIEW

[chest ap]
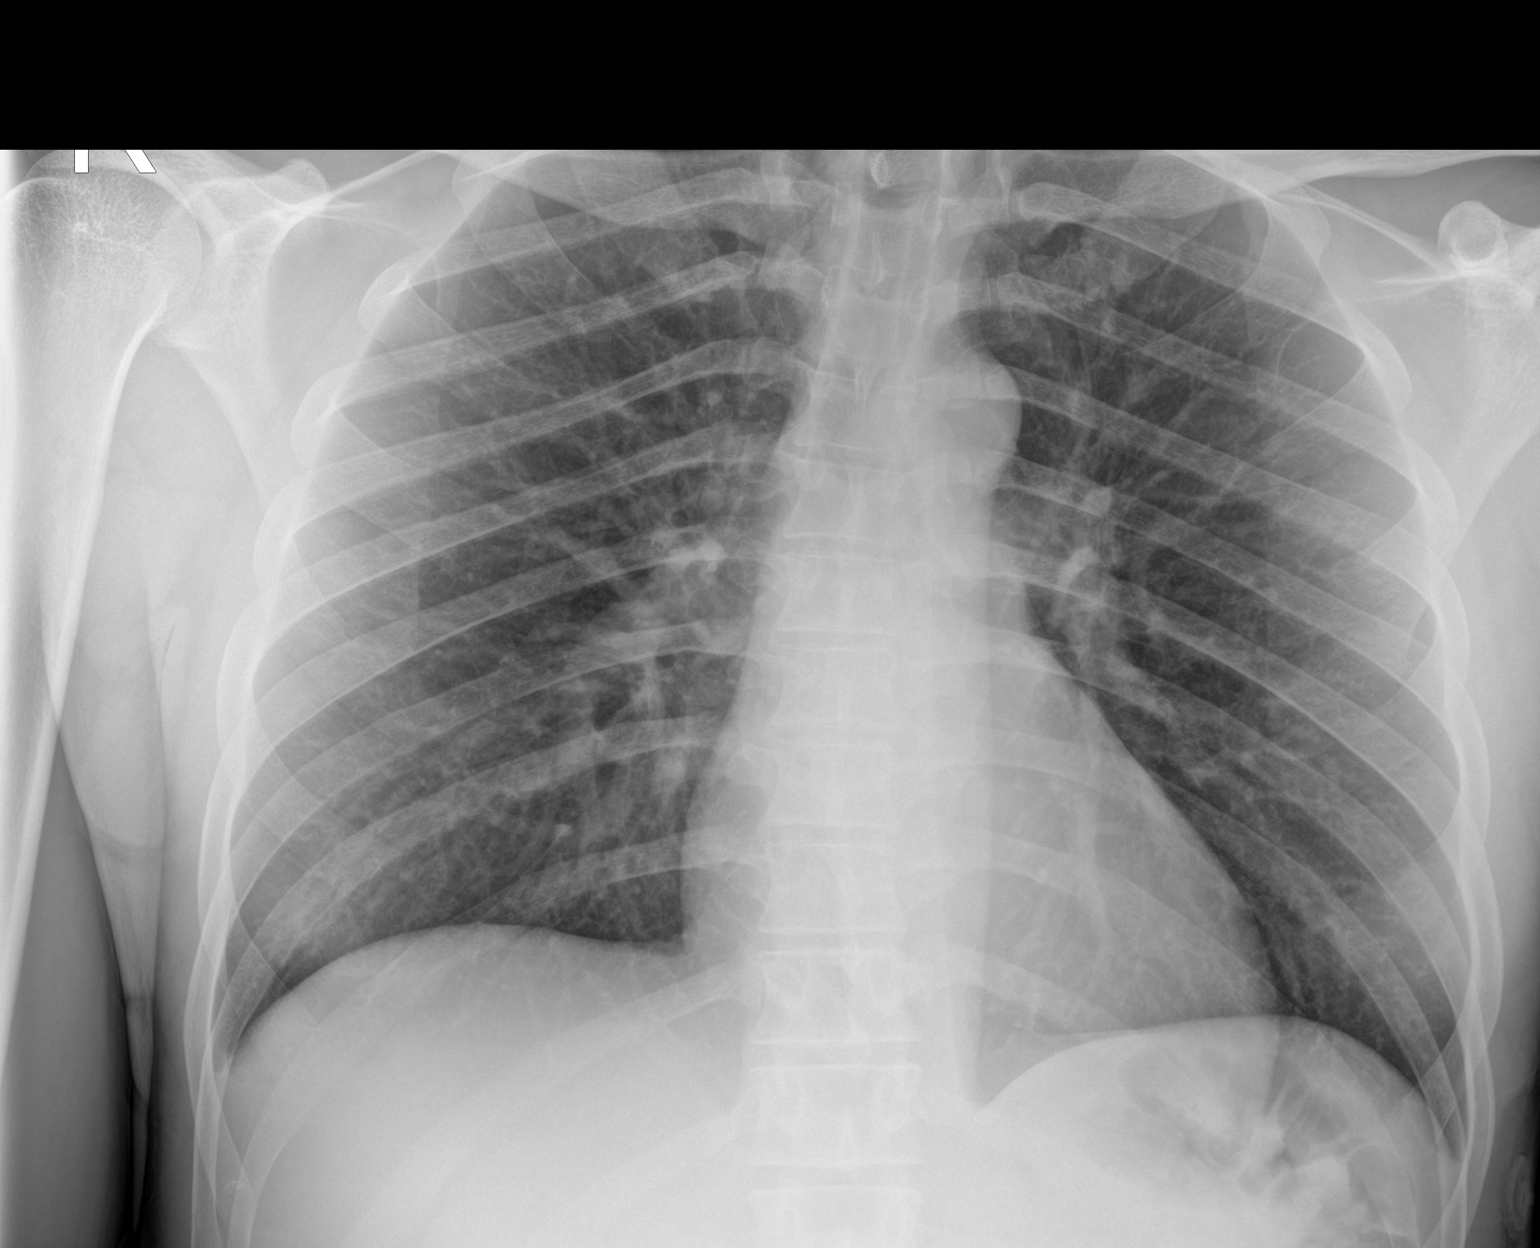

[1 of 1 positions shown; findings below may reference images not displayed]

FINDINGS: The heart size and mediastinal contours are within normal limits.
Both lungs are clear. No pleural effusion or pneumothorax. The
visualized skeletal structures are unremarkable.
IMPRESSION: No acute process or change from recent prior study.
